# Patient Record
Sex: Male | Born: 1995 | Race: White | Hispanic: No | Marital: Single
Health system: Midwestern US, Community
[De-identification: ages and names within clinical notes are randomized; demographics above are authoritative.]

## PROBLEM LIST (undated history)

## (undated) HISTORY — PX: TYMPANOSTOMY TUBE PLACEMENT: SHX32

---

## 2015-05-18 ENCOUNTER — Emergency Department
Admission: EM | Admit: 2015-05-18 | Discharge: 2015-05-19 | Disposition: A | Discharge status: Home / Self Care | Attending: Emergency Medicine | Admitting: Emergency Medicine

## 2015-05-18 ENCOUNTER — Encounter: Payer: Self-pay | Admitting: *Deleted

## 2015-05-18 DIAGNOSIS — R197 Diarrhea, unspecified: Secondary | ICD-10-CM | POA: Diagnosis not present

## 2015-05-18 DIAGNOSIS — I951 Orthostatic hypotension: Secondary | ICD-10-CM

## 2015-05-18 DIAGNOSIS — R55 Syncope and collapse: Secondary | ICD-10-CM | POA: Diagnosis not present

## 2015-05-18 LAB — BASIC METABOLIC PANEL
Anion gap: 13 (ref 5–15)
BUN: 21 mg/dL — AB (ref 6–20)
CHLORIDE: 95 mmol/L — AB (ref 101–111)
CO2: 24 mmol/L (ref 22–32)
CREATININE: 1.4 mg/dL — AB (ref 0.61–1.24)
Calcium: 9.6 mg/dL (ref 8.9–10.3)
GFR calc non Af Amer: >60 mL/min (ref >60)
Glucose, Bld: 130 mg/dL — ABNORMAL HIGH (ref 65–99)
POTASSIUM: 3.8 mmol/L (ref 3.5–5.1)
Sodium: 132 mmol/L — ABNORMAL LOW (ref 135–145)

## 2015-05-18 LAB — CBC
HEMATOCRIT: 42.7 % (ref 40.0–52.0)
HEMOGLOBIN: 14.3 g/dL (ref 13.0–18.0)
MCH: 29.4 pg (ref 26.0–34.0)
MCHC: 33.5 g/dL (ref 32.0–36.0)
MCV: 87.5 fL (ref 80.0–100.0)
Platelets: 275 10*3/uL (ref 150–440)
RBC: 4.88 MIL/uL (ref 4.40–5.90)
RDW: 13.2 % (ref 11.5–14.5)
WBC: 17.8 10*3/uL — ABNORMAL HIGH (ref 3.8–10.6)

## 2015-05-18 LAB — HEPATIC FUNCTION PANEL
ALBUMIN: 4.4 g/dL (ref 3.5–5.0)
ALK PHOS: 61 U/L (ref 38–126)
ALT: 13 U/L — AB (ref 17–63)
AST: 20 U/L (ref 15–41)
Bilirubin, Direct: 0.2 mg/dL (ref 0.1–0.5)
Indirect Bilirubin: 1 mg/dL — ABNORMAL HIGH (ref 0.3–0.9)
Total Bilirubin: 1.2 mg/dL (ref 0.3–1.2)
Total Protein: 8.1 g/dL (ref 6.5–8.1)

## 2015-05-18 NOTE — ED Notes (Signed)
Pt to room 7 via wheelchair.  Pt had syncopal episode in triage.  Pt also has diarrhea x 2.  Pt pale.

## 2015-05-19 ENCOUNTER — Emergency Department

## 2015-05-19 LAB — TROPONIN I
Troponin I: <0.03 ng/mL (ref <0.031)
Troponin I: <0.03 ng/mL (ref <0.031)

## 2015-05-19 MED ORDER — ONDANSETRON 4 MG PO TBDP: 4.0000 mg | ORAL_TABLET | Freq: Three times a day (TID) | ORAL | Status: AC | PRN

## 2015-05-19 MED ORDER — SODIUM CHLORIDE 0.9 % IV BOLUS (SEPSIS)
1000.0000 mL | Freq: Once | INTRAVENOUS | Status: AC
  Administered 2015-05-19: 1000 mL via INTRAVENOUS

## 2015-05-19 NOTE — Discharge Instructions (Signed)
Orthostatic Hypotension Orthostatic hypotension is a sudden drop in blood pressure. It happens when you quickly stand up from a seated or lying position. You may feel dizzy or light-headed. This can last for just a few seconds or for up to a few minutes. It is usually not a serious problem. However, if this happens frequently or gets worse, it can be a sign of something more serious. CAUSES  Different things can cause orthostatic hypotension, including:   Loss of body fluids (dehydration).  Medicines that lower blood pressure.  Sudden changes in posture, such as standing up quickly after you have been sitting or lying down.  Taking too much of your medicine. SIGNS AND SYMPTOMS   Light-headedness or dizziness.   Fainting or near-fainting.   A fast heart rate.   Weakness.   Feeling tired (fatigue).  DIAGNOSIS  Your health care provider may do several things to help diagnose your condition and identify the cause. These may include:   Taking a medical history and doing a physical exam.  Checking your blood pressure. Your health care provider will check your blood pressure when you are:  Lying down.  Sitting.  Standing.  Using tilt table testing. In this test, you lie down on a table that moves from a lying position to a standing position. You will be strapped onto the table. This test monitors your blood pressure and heart rate when you are in different positions. TREATMENT  Treatment will vary depending on the cause. Possible treatments include:   Changing the dosage of your medicines.  Wearing compression stockings on your lower legs.  Standing up slowly after sitting or lying down.  Eating more salt.  Eating frequent, small meals.  In some cases, getting IV fluids.  Taking medicine to enhance fluid retention. HOME CARE INSTRUCTIONS  Only take over-the-counter or prescription medicines as directed by your health care provider.  Follow your health care  provider's instructions for changing the dosage of your current medicines.  Do not stop or adjust your medicine on your own.  Stand up slowly after sitting or lying down. This allows your body to adjust to the different position.  Wear compression stockings as directed.  Eat extra salt as directed.  Do not add extra salt to your diet unless directed to by your health care provider.  Eat frequent, small meals.  Avoid standing suddenly after eating.  Avoid hot showers or excessive heat as directed by your health care provider.  Keep all follow-up appointments. SEEK MEDICAL CARE IF:  You continue to feel dizzy or light-headed after standing.  You feel groggy or confused.  You feel cold, clammy, or sick to your stomach (nauseous).  You have blurred vision.  You feel short of breath. SEEK IMMEDIATE MEDICAL CARE IF:   You faint after standing.  You have chest pain.  You have difficulty breathing.   You lose feeling or movement in your arms or legs.   You have slurred speech or difficulty talking, or you are unable to talk.  MAKE SURE YOU:   Understand these instructions.  Will watch your condition.  Will get help right away if you are not doing well or get worse.   This information is not intended to replace advice given to you by your health care provider. Make sure you discuss any questions you have with your health care provider.   Document Released: 12/17/2001 Document Revised: 01/01/2013 Document Reviewed: 10/19/2012 Elsevier Interactive Patient Education 2016 Elsevier Inc.  Syncope Syncope  is a medical term for fainting or passing out. This means you lose consciousness and drop to the ground. People are generally unconscious for less than 5 minutes. You may have some muscle twitches for up to 15 seconds before waking up and returning to normal. Syncope occurs more often in older adults, but it can happen to anyone. While most causes of syncope are not  dangerous, syncope can be a sign of a serious medical problem. It is important to seek medical care.  CAUSES  Syncope is caused by a sudden drop in blood flow to the brain. The specific cause is often not determined. Factors that can bring on syncope include:  Taking medicines that lower blood pressure.  Sudden changes in posture, such as standing up quickly.  Taking more medicine than prescribed.  Standing in one place for too long.  Seizure disorders.  Dehydration and excessive exposure to heat.  Low blood sugar (hypoglycemia).  Straining to have a bowel movement.  Heart disease, irregular heartbeat, or other circulatory problems.  Fear, emotional distress, seeing blood, or severe pain. SYMPTOMS  Right before fainting, you may:  Feel dizzy or light-headed.  Feel nauseous.  See all white or all black in your field of vision.  Have cold, clammy skin. DIAGNOSIS  Your health care provider will ask about your symptoms, perform a physical exam, and perform an electrocardiogram (ECG) to record the electrical activity of your heart. Your health care provider may also perform other heart or blood tests to determine the cause of your syncope which may include:  Transthoracic echocardiogram (TTE). During echocardiography, sound waves are used to evaluate how blood flows through your heart.  Transesophageal echocardiogram (TEE).  Cardiac monitoring. This allows your health care provider to monitor your heart rate and rhythm in real time.  Holter monitor. This is a portable device that records your heartbeat and can help diagnose heart arrhythmias. It allows your health care provider to track your heart activity for several days, if needed.  Stress tests by exercise or by giving medicine that makes the heart beat faster. TREATMENT  In most cases, no treatment is needed. Depending on the cause of your syncope, your health care provider may recommend changing or stopping some of your  medicines. HOME CARE INSTRUCTIONS  Have someone stay with you until you feel stable.  Do not drive, use machinery, or play sports until your health care provider says it is okay.  Keep all follow-up appointments as directed by your health care provider.  Lie down right away if you start feeling like you might faint. Breathe deeply and steadily. Wait until all the symptoms have passed.  Drink enough fluids to keep your urine clear or pale yellow.  If you are taking blood pressure or heart medicine, get up slowly and take several minutes to sit and then stand. This can reduce dizziness. SEEK IMMEDIATE MEDICAL CARE IF:   You have a severe headache.  You have unusual pain in the chest, abdomen, or back.  You are bleeding from your mouth or rectum, or you have black or tarry stool.  You have an irregular or very fast heartbeat.  You have pain with breathing.  You have repeated fainting or seizure-like jerking during an episode.  You faint when sitting or lying down.  You have confusion.  You have trouble walking.  You have severe weakness.  You have vision problems. If you fainted, call your local emergency services (911 in [place] Do not drive yourself  to the hospital.    This information is not intended to replace advice given to you by your health care provider. Make sure you discuss any questions you have with your health care provider.   Document Released: 12/27/2004 Document Revised: 05/13/2014 Document Reviewed: 02/25/2011 Elsevier Interactive Patient Education 2016 Elsevier Inc.  Diarrhea Diarrhea is frequent loose and watery bowel movements. It can cause you to feel weak and dehydrated. Dehydration can cause you to become tired and thirsty, have a dry mouth, and have decreased urination that often is dark yellow. Diarrhea is a sign of another problem, most often an infection that will not last long. In most cases, diarrhea typically lasts 2-3 days. However, it can  last longer if it is a sign of something more serious. It is important to treat your diarrhea as directed by your caregiver to lessen or prevent future episodes of diarrhea. CAUSES  Some common causes include:  Gastrointestinal infections caused by viruses, bacteria, or parasites.  Food poisoning or food allergies.  Certain medicines, such as antibiotics, chemotherapy, and laxatives.  Artificial sweeteners and fructose.  Digestive disorders. HOME CARE INSTRUCTIONS  Ensure adequate fluid intake (hydration): Have 1 cup (8 oz) of fluid for each diarrhea episode. Avoid fluids that contain simple sugars or sports drinks, fruit juices, whole milk products, and sodas. Your urine should be clear or pale yellow if you are drinking enough fluids. Hydrate with an oral rehydration solution that you can purchase at pharmacies, retail stores, and online. You can prepare an oral rehydration solution at home by mixing the following ingredients together:   - tsp table salt.   tsp baking soda.   tsp salt substitute containing potassium chloride.  1  tablespoons sugar.  1 L (34 oz) of water.  Certain foods and beverages may increase the speed at which food moves through the gastrointestinal (GI) tract. These foods and beverages should be avoided and include:  Caffeinated and alcoholic beverages.  High-fiber foods, such as raw fruits and vegetables, nuts, seeds, and whole grain breads and cereals.  Foods and beverages sweetened with sugar alcohols, such as xylitol, sorbitol, and mannitol.  Some foods may be well tolerated and may help thicken stool including:  Starchy foods, such as rice, toast, pasta, low-sugar cereal, oatmeal, grits, baked potatoes, crackers, and bagels.  Bananas.  Applesauce.  Add probiotic-rich foods to help increase healthy bacteria in the GI tract, such as yogurt and fermented milk products.  Wash your hands well after each diarrhea episode.  Only take  over-the-counter or prescription medicines as directed by your caregiver.  Take a warm bath to relieve any burning or pain from frequent diarrhea episodes. SEEK IMMEDIATE MEDICAL CARE IF:   You are unable to keep fluids down.  You have persistent vomiting.  You have blood in your stool, or your stools are black and tarry.  You do not urinate in 6-8 hours, or there is only a small amount of very dark urine.  You have abdominal pain that increases or localizes.  You have weakness, dizziness, confusion, or light-headedness.  You have a severe headache.  Your diarrhea gets worse or does not get better.  You have a fever or persistent symptoms for more than 2-3 days.  You have a fever and your symptoms suddenly get worse. MAKE SURE YOU:   Understand these instructions.  Will watch your condition.  Will get help right away if you are not doing well or get worse.   This information is not  intended to replace advice given to you by your health care provider. Make sure you discuss any questions you have with your health care provider.   Document Released: 12/17/2001 Document Revised: 01/17/2014 Document Reviewed: 09/04/2011 Elsevier Interactive Patient Education 2016 [employer].  Food Choices to Help Relieve Diarrhea, Adult When you have diarrhea, the foods you eat and your eating habits are very important. Choosing the right foods and drinks can help relieve diarrhea. Also, because diarrhea can last up to 7 days, you need to replace lost fluids and electrolytes (such as sodium, potassium, and chloride) in order to help prevent dehydration.  WHAT GENERAL GUIDELINES DO I NEED TO FOLLOW?  Slowly drink 1 cup (8 oz) of fluid for each episode of diarrhea. If you are getting enough fluid, your urine will be clear or pale yellow.  Eat starchy foods. Some good choices include white rice, white toast, pasta, low-fiber cereal, baked potatoes (without the skin), saltine crackers, and  bagels.  Avoid large servings of any cooked vegetables.  Limit fruit to two servings per day. A serving is  cup or 1 small piece.  Choose foods with less than 2 g of fiber per serving.  Limit fats to less than 8 tsp (38 g) per day.  Avoid fried foods.  Eat foods that have probiotics in them. Probiotics can be found in certain dairy products.  Avoid foods and beverages that may increase the speed at which food moves through the stomach and intestines (gastrointestinal tract). Things to avoid include:  High-fiber foods, such as dried fruit, raw fruits and vegetables, nuts, seeds, and whole grain foods.  Spicy foods and high-fat foods.  Foods and beverages sweetened with high-fructose corn syrup, honey, or sugar alcohols such as xylitol, sorbitol, and mannitol. WHAT FOODS ARE RECOMMENDED? Grains White rice. White, [place], or pita breads (fresh or toasted), including plain rolls, buns, or bagels. White pasta. Saltine, soda, or graham crackers. Pretzels. Low-fiber cereal. Cooked cereals made with water (such as cornmeal, farina, or cream cereals). Plain muffins. Matzo. Melba toast. Zwieback.  Vegetables Potatoes (without the skin). Strained tomato and vegetable juices. Most well-cooked and canned vegetables without seeds. Tender lettuce. Fruits Cooked or canned applesauce, apricots, cherries, fruit cocktail, grapefruit, peaches, pears, or plums. Fresh bananas, apples without skin, cherries, grapes, cantaloupe, grapefruit, peaches, oranges, or plums.  Meat and Other Protein Products Baked or boiled chicken. Eggs. Tofu. Fish. Seafood. Smooth peanut butter. Ground or well-cooked tender beef, ham, veal, lamb, pork, or poultry.  Dairy Plain yogurt, kefir, and unsweetened liquid yogurt. Lactose-free milk, buttermilk, or soy milk. Plain hard cheese. Beverages Sport drinks. Clear broths. Diluted fruit juices (except prune). Regular, caffeine-free sodas such as ginger ale. Water. Decaffeinated  teas. Oral rehydration solutions. Sugar-free beverages not sweetened with sugar alcohols. Other Bouillon, broth, or soups made from recommended foods.  The items listed above may not be a complete list of recommended foods or beverages. Contact your dietitian for more options. WHAT FOODS ARE NOT RECOMMENDED? Grains Whole grain, whole wheat, bran, or rye breads, rolls, pastas, crackers, and cereals. Wild or brown rice. Cereals that contain more than 2 g of fiber per serving. Corn tortillas or taco shells. Cooked or dry oatmeal. Granola. Popcorn. Vegetables Raw vegetables. Cabbage, broccoli, Brussels sprouts, artichokes, baked beans, beet greens, corn, kale, legumes, peas, sweet potatoes, and yams. Potato skins. Cooked spinach and cabbage. Fruits Dried fruit, including raisins and dates. Raw fruits. Stewed or dried prunes. Fresh apples with skin, apricots, mangoes, pears, raspberries, and  strawberries.  Meat and Other Protein Products Chunky peanut butter. Nuts and seeds. Beans and lentils. Tomasa BlaseBacon.  Dairy High-fat cheeses. Milk, chocolate milk, and beverages made with milk, such as milk shakes. Cream. Ice cream. Sweets and Desserts Sweet rolls, doughnuts, and sweet breads. Pancakes and waffles. Fats and Oils Butter. Cream sauces. Margarine. Salad oils. Plain salad dressings. Olives. Avocados.  Beverages Caffeinated beverages (such as coffee, tea, soda, or energy drinks). Alcoholic beverages. Fruit juices with pulp. Prune juice. Soft drinks sweetened with high-fructose corn syrup or sugar alcohols. Other Coconut. Hot sauce. Chili powder. Mayonnaise. Gravy. Cream-based or milk-based soups.  The items listed above may not be a complete list of foods and beverages to avoid. Contact your dietitian for more information. WHAT SHOULD I DO IF I BECOME DEHYDRATED? Diarrhea can sometimes lead to dehydration. Signs of dehydration include dark urine and dry mouth and skin. If you think you are dehydrated,  you should rehydrate with an oral rehydration solution. These solutions can be purchased at pharmacies, retail stores, or online.  Drink -1 cup (120-240 mL) of oral rehydration solution each time you have an episode of diarrhea. If drinking this amount makes your diarrhea worse, try drinking smaller amounts more often. For example, drink 1-3 tsp (5-15 mL) every 5-10 minutes.  A general rule for staying hydrated is to drink 1-2 L of fluid per day. Talk to your health care provider about the specific amount you should be drinking each day. Drink enough fluids to keep your urine clear or pale yellow.   This information is not intended to replace advice given to you by your health care provider. Make sure you discuss any questions you have with your health care provider.   Document Released: 03/19/2003 Document Revised: 01/17/2014 Document Reviewed: 11/19/2012 Elsevier Interactive Patient Education [employer]2016 Elsevier Inc.

## 2015-05-19 NOTE — ED Provider Notes (Signed)
[facility] Emergency Department Provider Note   ____________________________________________  Time seen: Approximately 2357 AM  I have reviewed the triage vital signs and the nursing notes.   HISTORY  Chief Complaint Loss of Consciousness and Diarrhea    HPI Jonathon Franco is a 20 y.o. male comes into the hospital today with a syncopal event. He reports that he was feeling ill since Saturday. He had his first episode of diarrhea today and reports that when he was walking back to his room he lost vision and blacked out. He reports that when he arrived to the hospital he had another syncopal event. He also had some diarrhea incontinence. The patient reports that he was found by his roommate on the floor and he is unsure how long he was out. The patient also reports he hit his head on the door frame when he passed out he has a small headache. He denies any abdominal pain. He reports the diarrhea was watery brown and mucousy. He had some mild shortness of breath before he passed out but nothing else. The patient reports he feels dizzy and lightheaded when he stands or sits up. This is never occurred before. The patient's friend who is with him had similar symptoms of diarrhea and syncope recently and also had to be in the hospital for severe dehydration.   History reviewed. No pertinent past medical history.  There are no active problems to display for this patient.   History reviewed. No pertinent past surgical history.  Current Outpatient Rx  Name  Route  Sig  Dispense  Refill  . amphetamine-dextroamphetamine (ADDERALL XR) 20 MG 24 hr capsule   Oral   Take 40 mg by mouth daily.      0   . ondansetron (ZOFRAN ODT) 4 MG disintegrating tablet   Oral   Take 1 tablet (4 mg total) by mouth every 8 (eight) hours as needed for nausea or vomiting.   20 tablet   0     Allergies Biaxin; Cobalt; Erythromycin; Morphine and related; Nickel; Penicillins; and  Sulfa antibiotics  History reviewed. No pertinent family history.  Social History Social History  Substance Use Topics  . Smoking status: Never Smoker   . Smokeless tobacco: None  . Alcohol Use: No    Review of Systems Constitutional: No fever/chills Eyes: No visual changes. ENT: No sore throat. Cardiovascular: Denies chest pain. Respiratory: Mild shortness of breath. Gastrointestinal:Diarrhea with no nausea or vomiting Genitourinary: Negative for dysuria. Musculoskeletal: Negative for back pain. Skin: Negative for rash. Neurological: Syncope  10-point ROS otherwise negative.  ____________________________________________   PHYSICAL EXAM:  VITAL SIGNS: ED Triage Vitals  Enc Vitals Group     BP 05/18/15 2257 111/74 mmHg     Pulse Rate 05/18/15 2257 99     Resp 05/18/15 2257 20     Temp 05/18/15 2257 97.4 F (36.3 C)     Temp Source 05/18/15 2257 Oral     SpO2 05/18/15 2257 99 %     Weight 05/18/15 2257 180 lb (81.647 kg)     Height 05/18/15 2257 6\' 6"  (1.981 m)     Head Cir --      Peak Flow --      Pain Score 05/18/15 2259 3     Pain Loc --      Pain Edu? --      Excl. in GC? --     Constitutional: Alert and oriented. Well appearing and inMild distress. Eyes:  Conjunctivae are normal. PERRL. EOMI. Head: Atraumatic. Nose: No congestion/rhinnorhea. Mouth/Throat: Mucous membranes are moist.  Oropharynx non-erythematous. Cardiovascular: Tachycardia regular rhythm. Grossly normal heart sounds.  Good peripheral circulation. Respiratory: Normal respiratory effort.  No retractions. Lungs CTAB. Gastrointestinal: Soft and nontender. No distention. Positive bowel sounds  Musculoskeletal: No lower extremity tenderness nor edema.   Neurologic:  Normal speech and language. Cranial nerves II through XII are grossly intact with no focal motor or neuro deficits. Skin:  Skin is warm, dry and intact.  Psychiatric: Mood and affect are normal.    ____________________________________________   LABS (all labs ordered are listed, but only abnormal results are displayed)  Labs Reviewed  CBC - Abnormal; Notable for the following:    WBC 17.8 (*)    All other components within normal limits  BASIC METABOLIC PANEL - Abnormal; Notable for the following:    Sodium 132 (*)    Chloride 95 (*)    Glucose, Bld 130 (*)    BUN 21 (*)    Creatinine, Ser 1.40 (*)    All other components within normal limits  HEPATIC FUNCTION PANEL - Abnormal; Notable for the following:    ALT 13 (*)    Indirect Bilirubin 1.0 (*)    All other components within normal limits  TROPONIN I  TROPONIN I   ____________________________________________  EKG  ED ECG REPORT I, Rebecka ApleyWebster,  Allison P, the attending physician, personally viewed and interpreted this ECG.   Date: 05/19/2015  EKG Time: 2257  Rate: 100  Rhythm: normal sinus rhythm  Axis: Normal  Intervals:none  ST&T Change: Flipped T waves in leads V2  ____________________________________________  RADIOLOGY  CT head: Normal brain, severe paranasal disease, particularly on the right, chronicity indeterminate ____________________________________________   PROCEDURES  Procedure(s) performed: None  Critical Care performed: No  ____________________________________________   INITIAL IMPRESSION / ASSESSMENT AND PLAN / ED COURSE  Pertinent labs & imaging results that were available during my care of the patient were reviewed by me and considered in my medical decision making (see chart for details).  This is a 20 year old male who comes in for hospital today with some diarrhea and a syncopal event. The patient appears to be dehydrated as he has some mild elevation of his creatinine as well as a white blood cell count of 17. The patient is not having any pain at this time. He received a liter of normal saline but still remains orthostatic with a heart rate that goes from the 90s to 115  laying to standing. I will give the patient a second liter of normal saline and sent him for a CT scan of his head. I will also in short to check the patient's troponin. He'll be reassessed once he received his second liter.  The patient has been sitting in his room he is not having any feelings of nausea and he has no longer having feelings of dizziness. His heart rate is improved and he reports he is ready to go. I did repeat the troponin and it was also negative. The patient will be discharged to home to follow-up with acute care clinic or with student health. The patient has no further complaints or concerns and he understands the plans as stated. ____________________________________________   FINAL CLINICAL IMPRESSION(S) / ED DIAGNOSES  Final diagnoses:  Syncope, unspecified syncope type  Orthostatic hypotension  Diarrhea of presumed infectious origin      NEW MEDICATIONS STARTED DURING THIS VISIT:  New Prescriptions   ONDANSETRON (ZOFRAN ODT)  4 MG DISINTEGRATING TABLET    Take 1 tablet (4 mg total) by mouth every 8 (eight) hours as needed for nausea or vomiting.     Note:  This document was prepared using Dragon voice recognition software and may include unintentional dictation errors.    Rebecka Apley, MD 05/19/15 [phone]

## 2016-12-26 ENCOUNTER — Ambulatory Visit
Admit: 2016-12-26 | Discharge: 2016-12-26 | Payer: PRIVATE HEALTH INSURANCE | Attending: Surgery | Primary: Family Medicine

## 2016-12-26 DIAGNOSIS — L723 Sebaceous cyst: Secondary | ICD-10-CM

## 2016-12-26 NOTE — Progress Notes (Signed)
[facility] History and Physical    History of Present Illness:      Samuel Carrillo is a 21 y.o. male who has been having an infection under the left axilla.  He has had one or two recent infections of a possible cyst of the L axilla. He has been on abx recently and the infection has cleared. He does not have any pain or redness of the area currently.  He also has a cyst of the upper back.  This one has never been infected and does not cause any pain or issues.      PMH - none    Past Surgical History:   Procedure Laterality Date   ??? HX HEENT     ??? HX TYMPANOSTOMY     ??? HX WISDOM TEETH EXTRACTION           Current Outpatient Medications:   ???  ADDERALL XR 20 mg XR capsule, , Disp: , Rfl:     Allergies   Allergen Reactions   ??? Erythromycin Rash   ??? Morphine Rash   ??? Nickel Rash   ??? Pcn [Penicillins] Rash   ??? Sulfa (Sulfonamide Antibiotics) Rash       Social History     Socioeconomic History   ??? Marital status: SINGLE     Spouse name: Not on file   ??? Number of children: Not on file   ??? Years of education: Not on file   ??? Highest education level: Not on file   Social Needs   ??? Financial resource strain: Not on file   ??? Food insecurity - worry: Not on file   ??? Food insecurity - inability: Not on file   ??? Transportation needs - medical: Not on file   ??? Transportation needs - non-medical: Not on file   Occupational History   ??? Not on file   Tobacco Use   ??? Smoking status: Never Smoker   ??? Smokeless tobacco: Never Used   Substance and Sexual Activity   ??? Alcohol use: Yes     Alcohol/week: 2.4 - 3.0 oz     Types: 4 - 5 Cans of beer per week     Frequency: Never   ??? Drug use: No   ??? Sexual activity: No   Other Topics Concern   ??? Not on file   Social History Narrative   ??? Not on file       Family History   Problem Relation Age of Onset   ??? Cancer Mother    ??? Cancer Maternal Grandmother    ??? Diabetes Maternal Grandmother    ??? Heart Disease Paternal Grandfather    ??? Cancer Paternal Grandfather        ROS    Constitutional: negative  Ears, Nose, Mouth, Throat, and Face: negative  Respiratory: negative  Cardiovascular: negative  Gastrointestinal: negative  Genitourinary:negative  Integument/Breast: negative  Hematologic/Lymphatic: negative  Behavioral/Psychiatric: negative  Allergic/Immunologic: negative      Physical Exam:     Visit Vitals  BP 111/73 (BP 1 Location: Left arm, BP Patient Position: Sitting)   Pulse (!) 131   Temp 98 ??F (36.7 ??C) (Oral)   Resp 18   Ht 6\' 6"  (1.981 m)   Wt 166 lb 12.8 oz (75.7 kg)   SpO2 97%   BMI 19.28 kg/m??       General - alert and oriented, no apparent distress  HEENT - no jaundice, no hearing imparement  Pulm - CTAB, no C/W/R  CV - RRR, no M/R/G  Abd - soft, ND  Ext - pulses intact in UE and LE bilaterally, no edema  Skin - supple, no rashes, L axilla skin normal no cyst or infection present, upper back with small 3mm sebaceous cyst, no erythema or drainage  Psychiatric - normal affect, good mood    Labs  none    Imaging  none  I have reviewed and agree with all of the pertinent images    Assessment:     Samuel Carrillo is a [age] y.oAlda Carrillo. male with sebaceous cyst of the back    Recommendations:     1.   He does not appear to have any cyst in the left axilla that I can see or feel on exam.  No surgery is needed for this.  He does have a small sebaceous cyst of the upper back and has not had any issues with this.  He would like to avoid surgery at this time which I think is reasonable.  He will follow up with me PRN.    Marton RedwoodNathan Zorion Nims, MD    Mr. Doristine CounterBurnett has a reminder for a "due or due soon" health maintenance. I have asked that he contact his primary care provider for follow-up on this health maintenance.

## 2016-12-26 NOTE — Progress Notes (Signed)
1. Have you been to the ER, urgent care clinic since your last visit?  Hospitalized since your last visit?No    2. Have you seen or consulted any other health care providers outside of the [facility] since your last visit?  Include any pap smears or colon screening. No

## 2016-12-26 NOTE — Patient Instructions (Signed)
Epidermoid Cyst: Care Instructions  Your Care Instructions  An epidermoid (say "eh-pih-DER-moyd") cyst is a lump just under the skin. These cysts can form when a hair follicle becomes blocked. They are common in acne and may occur on the face, neck, back, and genitals. However, they can form anywhere on the body. These cysts are not cancer and do not lead to cancer. They tend not to hurt, but they can sometimes become swollen and painful. They also may break open (rupture) and cause scarring.  These cysts sometimes do not cause problems and may not need treatment. If you have a cyst that is swollen and hurts, your doctor may inject it with a medicine to help it heal. But it is more likely that a painful cyst will need to be removed. Your doctor will give you a shot of numbing medicine and cut into the cyst to drain it or remove it. This makes the symptoms go away.  Follow-up care is a key part of your treatment and safety. Be sure to make and go to all appointments, and call your doctor if you are having problems. It's also a good idea to know your test results and keep a list of the medicines you take.  How can you care for yourself at home?  ?? Do not squeeze the cyst or poke it with a needle to open it. This can cause swelling, redness, and infection.  ?? Always have a doctor look at any new lumps you get to make sure that they are not serious.  When should you call for help?  Watch closely for changes in your health, and be sure to contact your doctor if:  ?? ?? You have a fever, redness, or swelling after you get a shot of medicine in the cyst.   ?? ?? You see or feel a new lump on your skin.   Where can you learn more?  Go to http://www.healthwise.net/[employer].  Enter S615 in the search box to learn more about "Epidermoid Cyst: Care Instructions."  Current as of: April 27, 2016  Content Version: 11.8  ?? 2006-2018 [employer]. Care instructions adapted under  license by [employer] (which disclaims liability or warranty for this information). If you have questions about a medical condition or this instruction, always ask your healthcare professional. Healthwise, Incorporated disclaims any warranty or liability for your use of this information.

## 2017-01-28 ENCOUNTER — Emergency Department
Admission: EM | Admit: 2017-01-28 | Discharge: 2017-01-28 | Disposition: A | Discharge status: Home / Self Care | Attending: Emergency Medicine | Admitting: Emergency Medicine

## 2017-01-28 ENCOUNTER — Emergency Department

## 2017-01-28 DIAGNOSIS — Y929 Unspecified place or not applicable: Secondary | ICD-10-CM | POA: Insufficient documentation

## 2017-01-28 DIAGNOSIS — Y999 Unspecified external cause status: Secondary | ICD-10-CM | POA: Diagnosis not present

## 2017-01-28 DIAGNOSIS — S62326A Displaced fracture of shaft of fifth metacarpal bone, right hand, initial encounter for closed fracture: Secondary | ICD-10-CM | POA: Diagnosis not present

## 2017-01-28 DIAGNOSIS — S6991XA Unspecified injury of right wrist, hand and finger(s), initial encounter: Secondary | ICD-10-CM | POA: Diagnosis present

## 2017-01-28 DIAGNOSIS — Z79899 Other long term (current) drug therapy: Secondary | ICD-10-CM | POA: Diagnosis not present

## 2017-01-28 DIAGNOSIS — S62324A Displaced fracture of shaft of fourth metacarpal bone, right hand, initial encounter for closed fracture: Secondary | ICD-10-CM | POA: Diagnosis not present

## 2017-01-28 DIAGNOSIS — Y939 Activity, unspecified: Secondary | ICD-10-CM | POA: Insufficient documentation

## 2017-01-28 DIAGNOSIS — W2209XA Striking against other stationary object, initial encounter: Secondary | ICD-10-CM | POA: Diagnosis not present

## 2017-01-28 DIAGNOSIS — F17210 Nicotine dependence, cigarettes, uncomplicated: Secondary | ICD-10-CM | POA: Diagnosis not present

## 2017-01-28 DIAGNOSIS — T1490XA Injury, unspecified, initial encounter: Secondary | ICD-10-CM

## 2017-01-28 MED ORDER — OXYCODONE-ACETAMINOPHEN 5-325 MG PO TABS: 1.0000 | ORAL_TABLET | Freq: Four times a day (QID) | ORAL | 0 refills | Status: AC | PRN

## 2017-01-28 MED ORDER — DOCUSATE SODIUM 100 MG PO CAPS: ORAL_CAPSULE | ORAL | 0 refills | Status: AC

## 2017-01-28 NOTE — ED Notes (Signed)
Pt friend to desk again "can we just get someone in here to fix his hand, he's getting anxious". Delay again explained and this RN apologized to pt and friends for the wait

## 2017-01-28 NOTE — ED Triage Notes (Signed)
Patient reports etoh on board.
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
## 2017-01-28 NOTE — ED Triage Notes (Signed)
Patient c/o reports fall approx 1 hour ago. Patient has hand swelling to wrist and hand.
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
## 2017-01-28 NOTE — ED Notes (Addendum)
ED Provider at bedside. Update from ortho doc and plan of care

## 2017-01-28 NOTE — ED Notes (Signed)

## 2017-01-28 NOTE — Discharge Instructions (Signed)
As we discussed, you have displaced fractures in the fourth and fifth bones in your right hand (metacarpals).  We splinted you temporarily to keep them immobilized, but it is very important that you follow up next week with the orthopedic specialist.  We provided the name and phone number for Dr. Hyacinth MeekerMiller, but any of his colleagues will be able to help you as well.  You may require surgery (pinning of the bones) to provide a long-term fix.  We recommend you call the office on Monday morning and tell them you were seen in the emergency department overnight on Friday, that you have a broken hand, and that Dr. Hyacinth MeekerMiller said you needed a follow-up appointment.  They will be able to schedule you for an appropriate date and time.  Please read through the included information.  Keep your splint clean and dry.  If your pain worsens significantly or if you lose sensation in your fingers, please return immediately to the emergency department.  Use over-the-counter ibuprofen and/or Tylenol as needed according to label instructions for pain.  Take Percocet as prescribed for severe pain. Do not drink alcohol, drive or participate in any other potentially dangerous activities while taking this medication as it may make you sleepy. Do not take this medication with any other sedating medications, either prescription or over-the-counter. If you were prescribed Percocet or Vicodin, do not take these with acetaminophen (Tylenol) as it is already contained within these medications.   This medication is an opiate (or narcotic) pain medication and can be habit forming.  Use it as little as possible to achieve adequate pain control.  Do not use or use it with extreme caution if you have a history of opiate abuse or dependence.  If you are on a pain contract with your primary care doctor or a pain specialist, be sure to let them know you were prescribed this medication today from the [facility].  This  medication is intended for your use only - do not give any to anyone else and keep it in a secure place where nobody else, especially children, have access to it.  It will also cause or worsen constipation, so you may want to consider taking an over-the-counter stool softener while you are taking this medication.

## 2017-01-28 NOTE — ED Notes (Signed)
esignature did not transfer to screen

## 2017-01-28 NOTE — ED Notes (Signed)
Friend to nurses desk requesting to please get a splint and leave, "we're all just really tired." pt friend was reassured EDP will be with pt as soon as possible

## 2017-01-28 NOTE — ED Notes (Addendum)
ED Provider at bedside. Discussion of splint
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
## 2017-01-30 ENCOUNTER — Inpatient Hospital Stay
Admit: 2017-01-30 | Discharge: 2017-01-30 | Disposition: A | Discharge status: Home / Self Care | Payer: TRICARE (CHAMPUS) | Attending: Emergency Medicine

## 2017-01-30 ENCOUNTER — Emergency Department: Admit: 2017-01-30 | Payer: TRICARE (CHAMPUS) | Primary: Family Medicine

## 2017-01-30 DIAGNOSIS — S62304A Unspecified fracture of fourth metacarpal bone, right hand, initial encounter for closed fracture: Secondary | ICD-10-CM

## 2017-01-30 MED ORDER — HYDROCODONE-ACETAMINOPHEN 7.5 MG-325 MG TAB
ORAL_TABLET | Freq: Three times a day (TID) | ORAL | 0 refills | Status: AC | PRN
Start: 2017-01-30 — End: ?

## 2017-01-30 NOTE — ED Triage Notes (Signed)
Patient states he broke his right 4th and 5th fingers. Patient was told he needed to have surgery but needs a referral to orthopedics.

## 2017-01-30 NOTE — ED Notes (Signed)
NP reviewed discharge instructions with patient. Patient verbalized understanding. Time allotted for questions. A&O at time of discharge. VSS. Patient ambulatory off unit.
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
## 2017-06-04 IMAGING — CT CT HEAD W/O CM
1 series · 16 of 30 positions shown, 20 images · non-contrast
Comparison: None.

CLINICAL DATA: Syncope, striking the top right side of his head.

EXAM:
CT HEAD WITHOUT CONTRAST
TECHNIQUE: Contiguous axial images were obtained from the base of the skull
through the vertex without intravenous contrast.

[Series 2: head wo · axial · 0.47mm/px · z∈[-149,+9]mm · 16 of 36 slices shown, 20 images]
[im 2/36  brain]
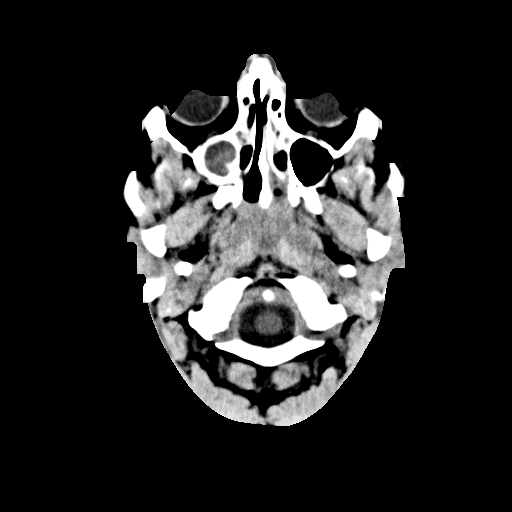
[im 2/36  bone]
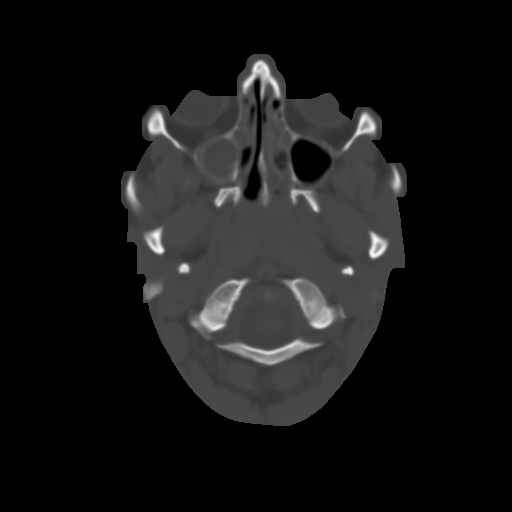
[im 4/36  brain]
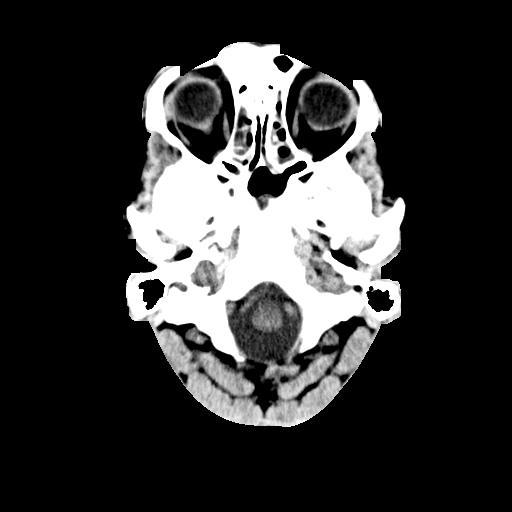
[im 7/36  brain]
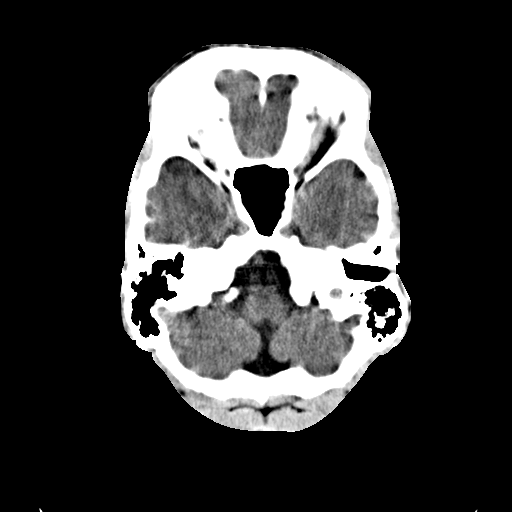
[im 9/36  brain]
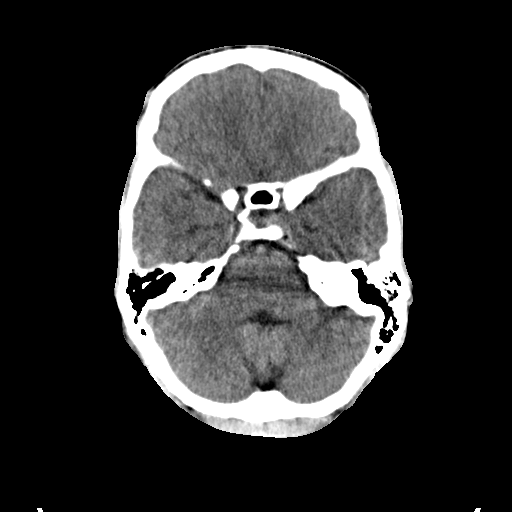
[im 10/36  brain]
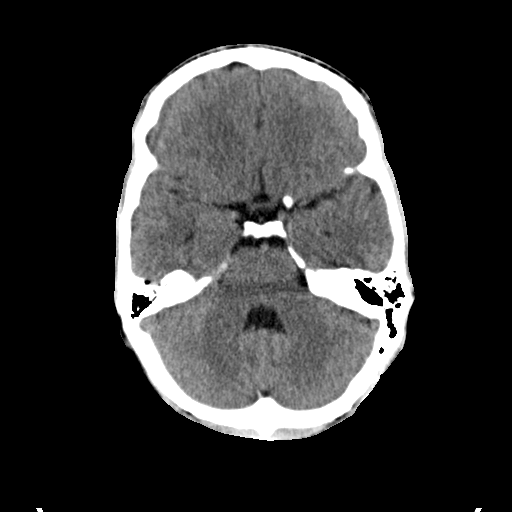
[im 10/36  bone]
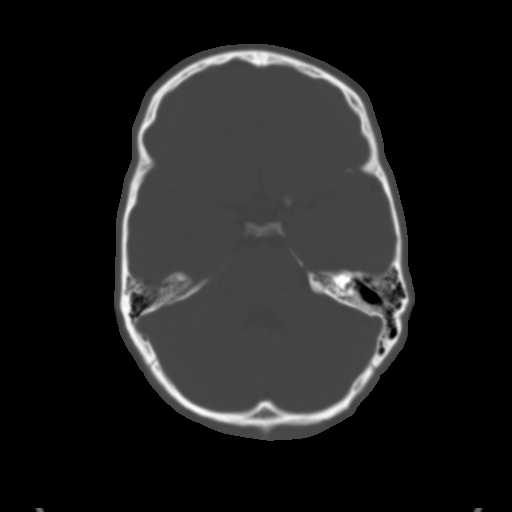
[im 13/36  brain]
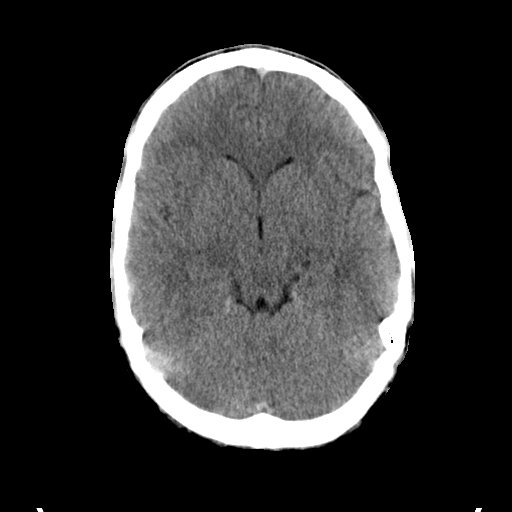
[im 15/36  brain]
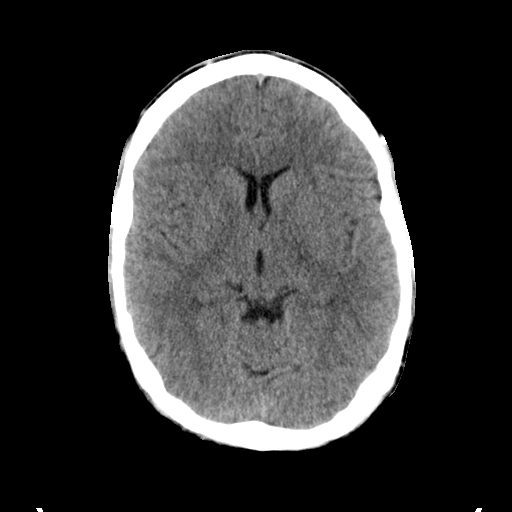
[im 17/36  brain]
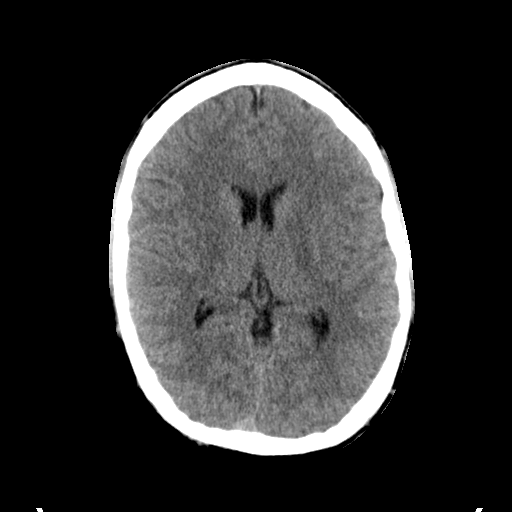
[im 19/36  brain]
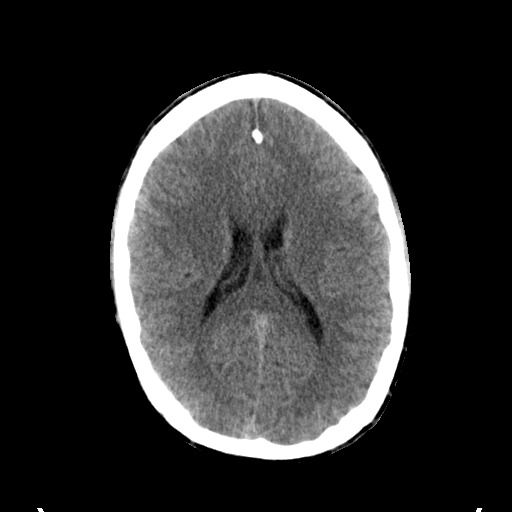
[im 19/36  bone]
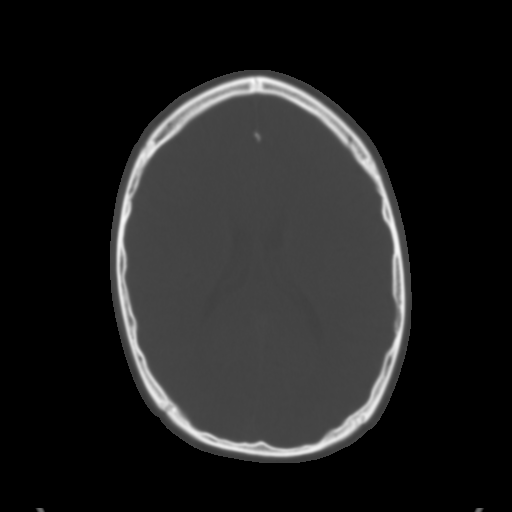
[im 21/36  brain]
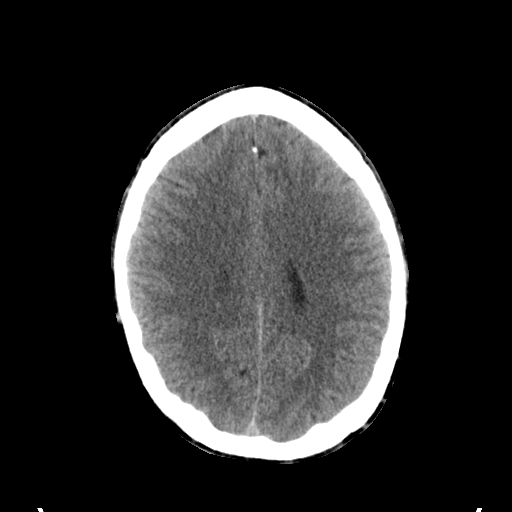
[im 23/36  brain]
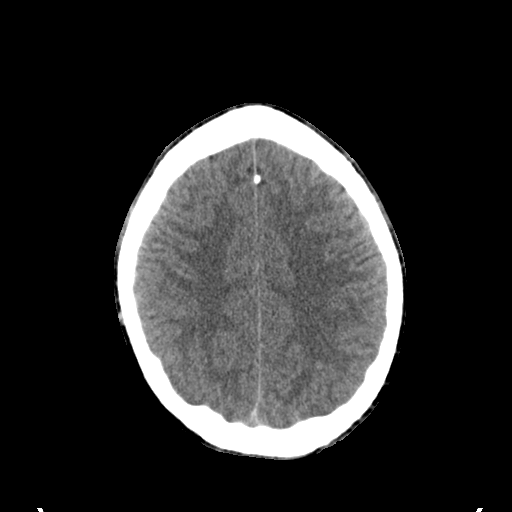
[im 26/36  brain]
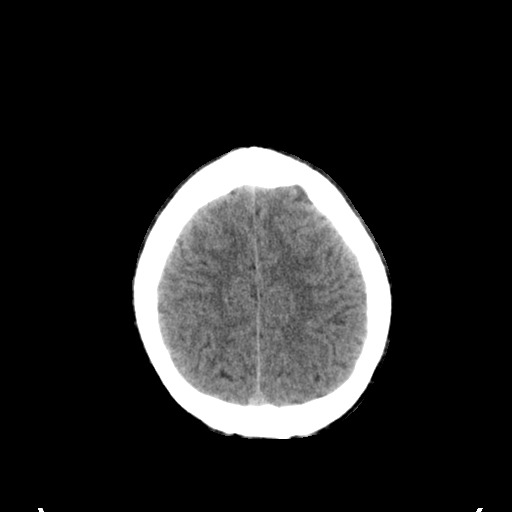
[im 27/36  brain]
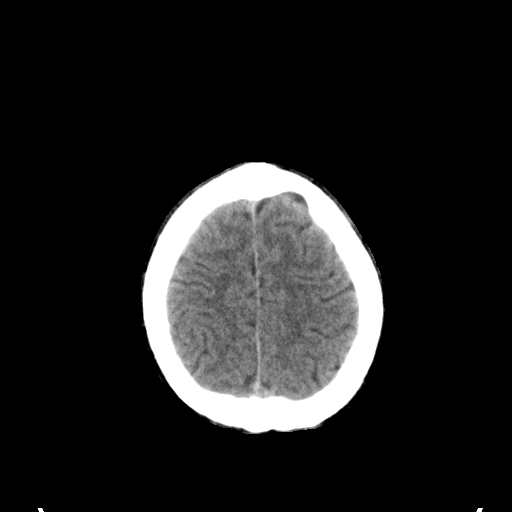
[im 27/36  bone]
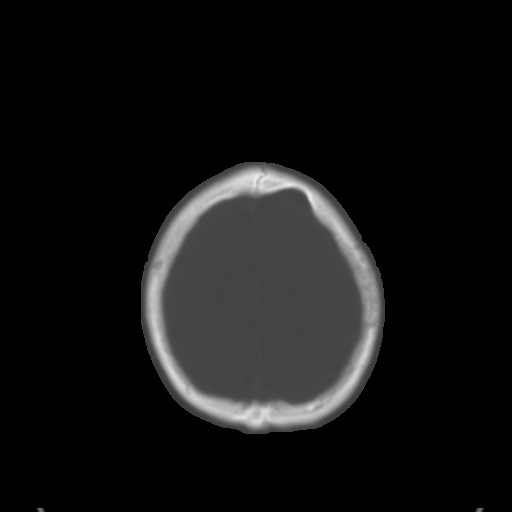
[im 29/36  brain]
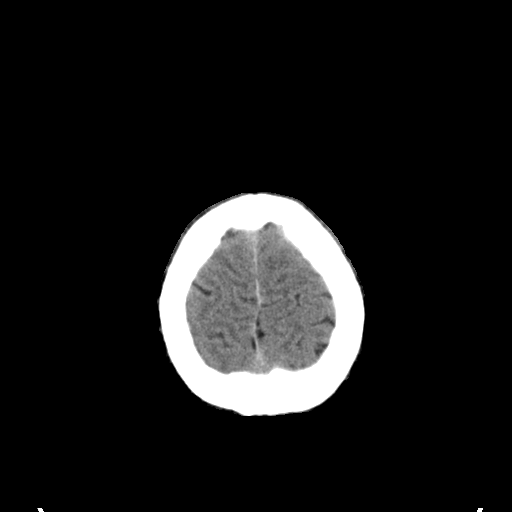
[im 32/36  brain]
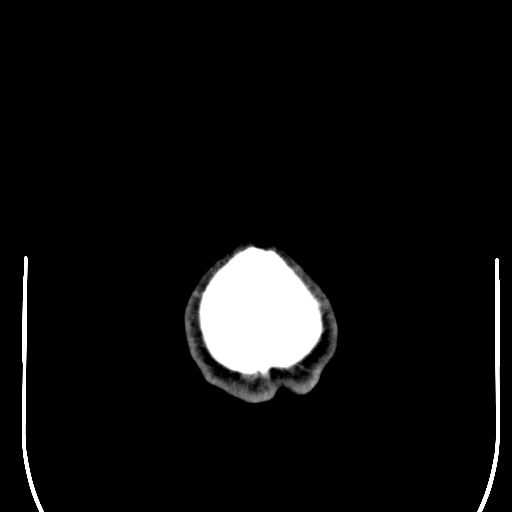
[im 34/36  brain]
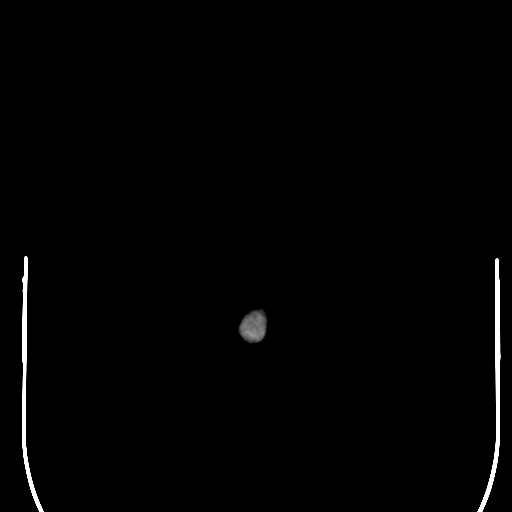

[16 of 30 positions shown; findings below may reference images not displayed]

FINDINGS: There is no intracranial hemorrhage, mass or evidence of acute
infarction. There is no extra-axial fluid collection. Gray matter
and white matter appear normal. Cerebral volume is normal for age.
Brainstem and posterior fossa are unremarkable. The CSF spaces
appear normal.

The bony structures are intact. There is near complete opacification
of the ethmoid air cells. Right frontal sinus is completely
opacified. Upper portion of the right maxillary sinus is completely
opacified. No frank bony destruction. The orbits are unremarkable.
IMPRESSION: 1. Normal brain
2. Severe paranasal sinus disease, particularly on the right.
Chronicity indeterminate.

## 2019-02-14 IMAGING — CR DG HAND COMPLETE 3+V*R*
3 series · 3 of 3 positions shown · non-contrast
Comparison: None.

CLINICAL DATA: 21-year-old male with fall and right wrist pain.

EXAM:
RIGHT HAND - COMPLETE 3+ VIEW; RIGHT WRIST - COMPLETE 3+ VIEW

[hand ap]
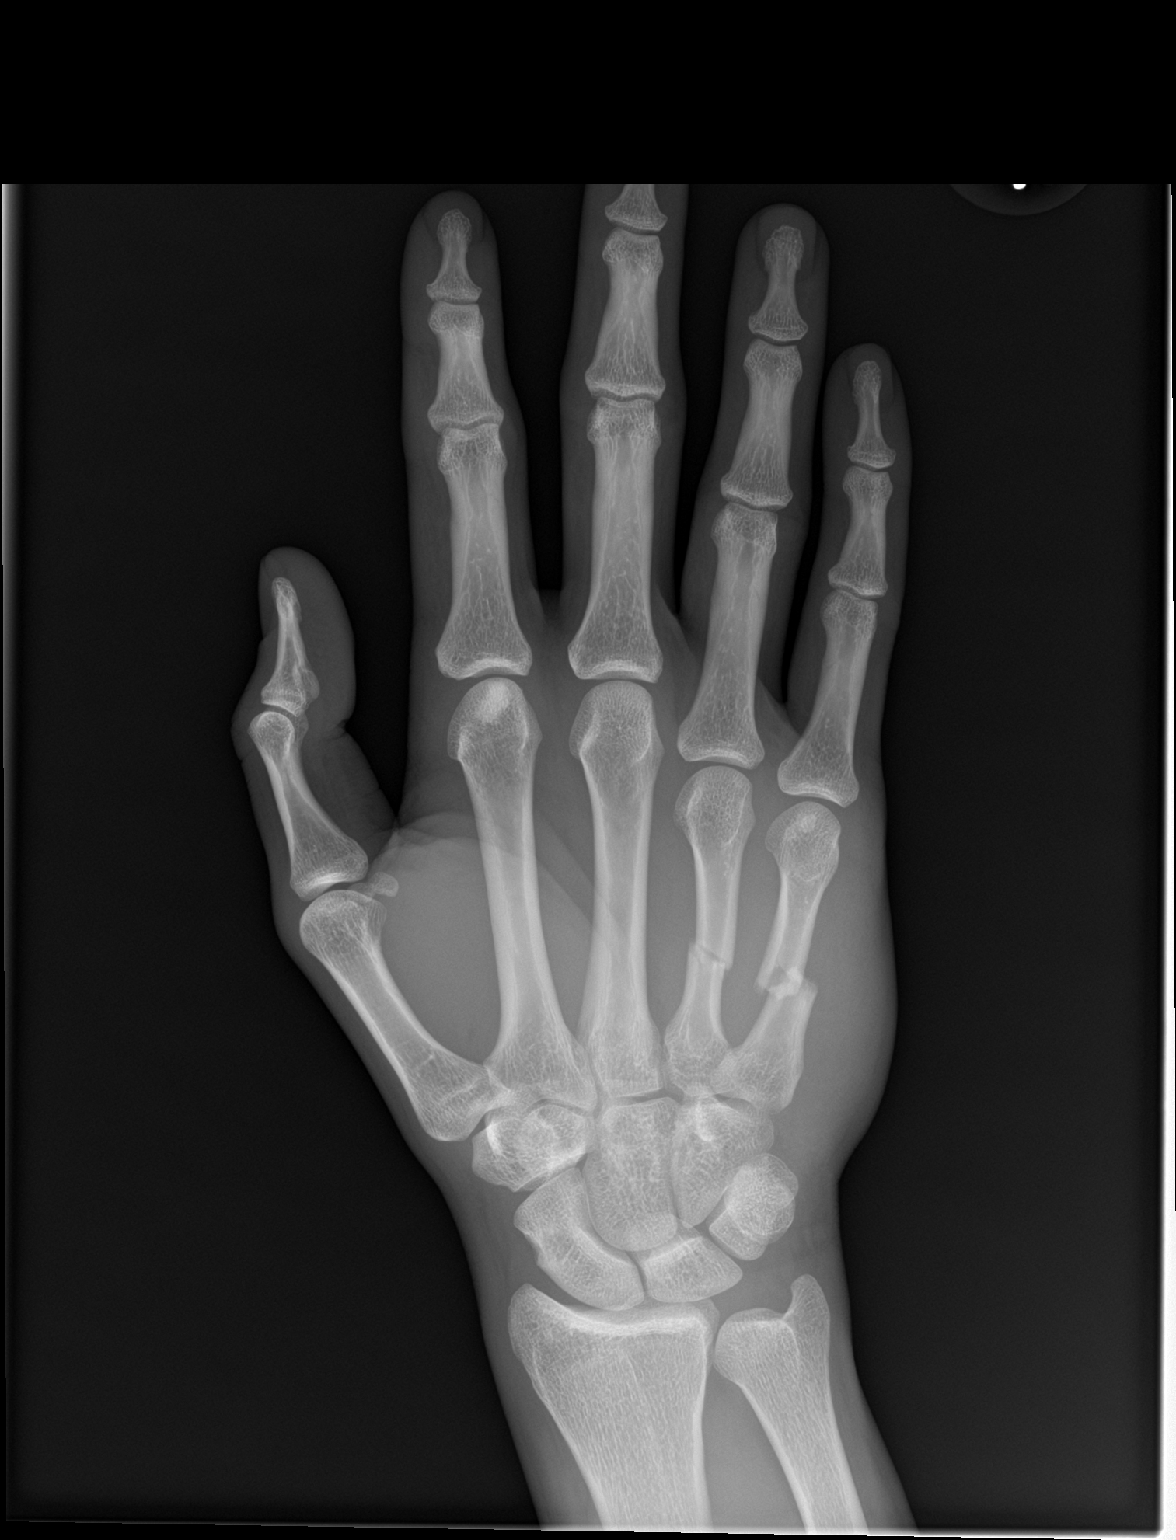

[hand obl]
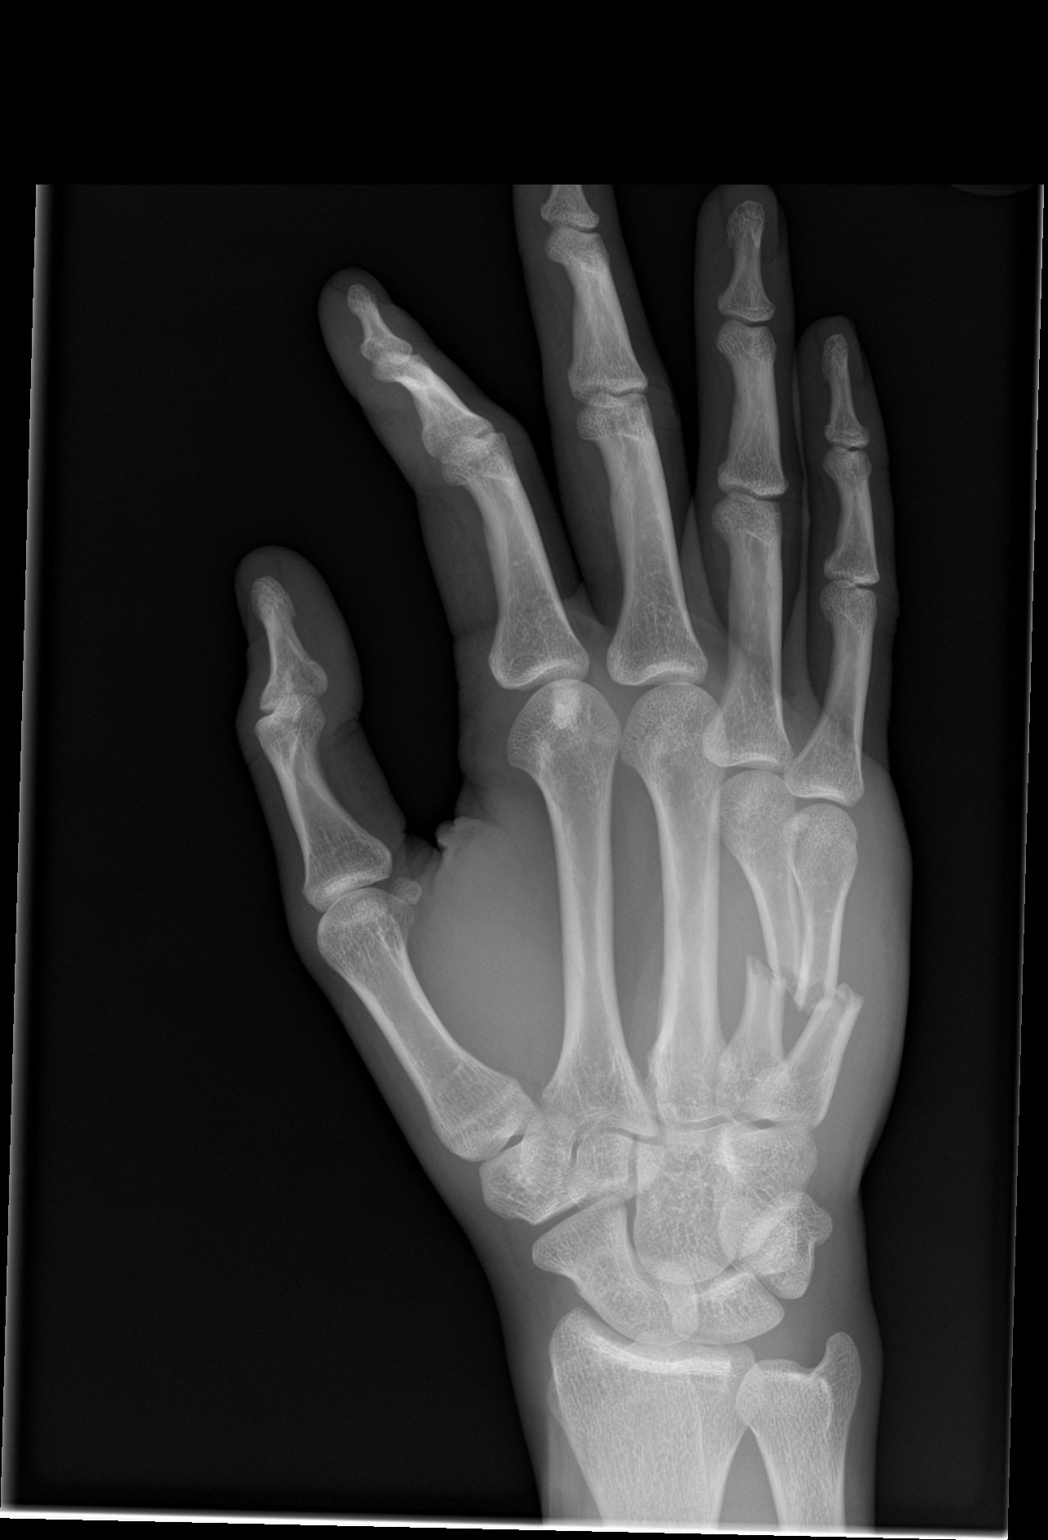

[hand lat]
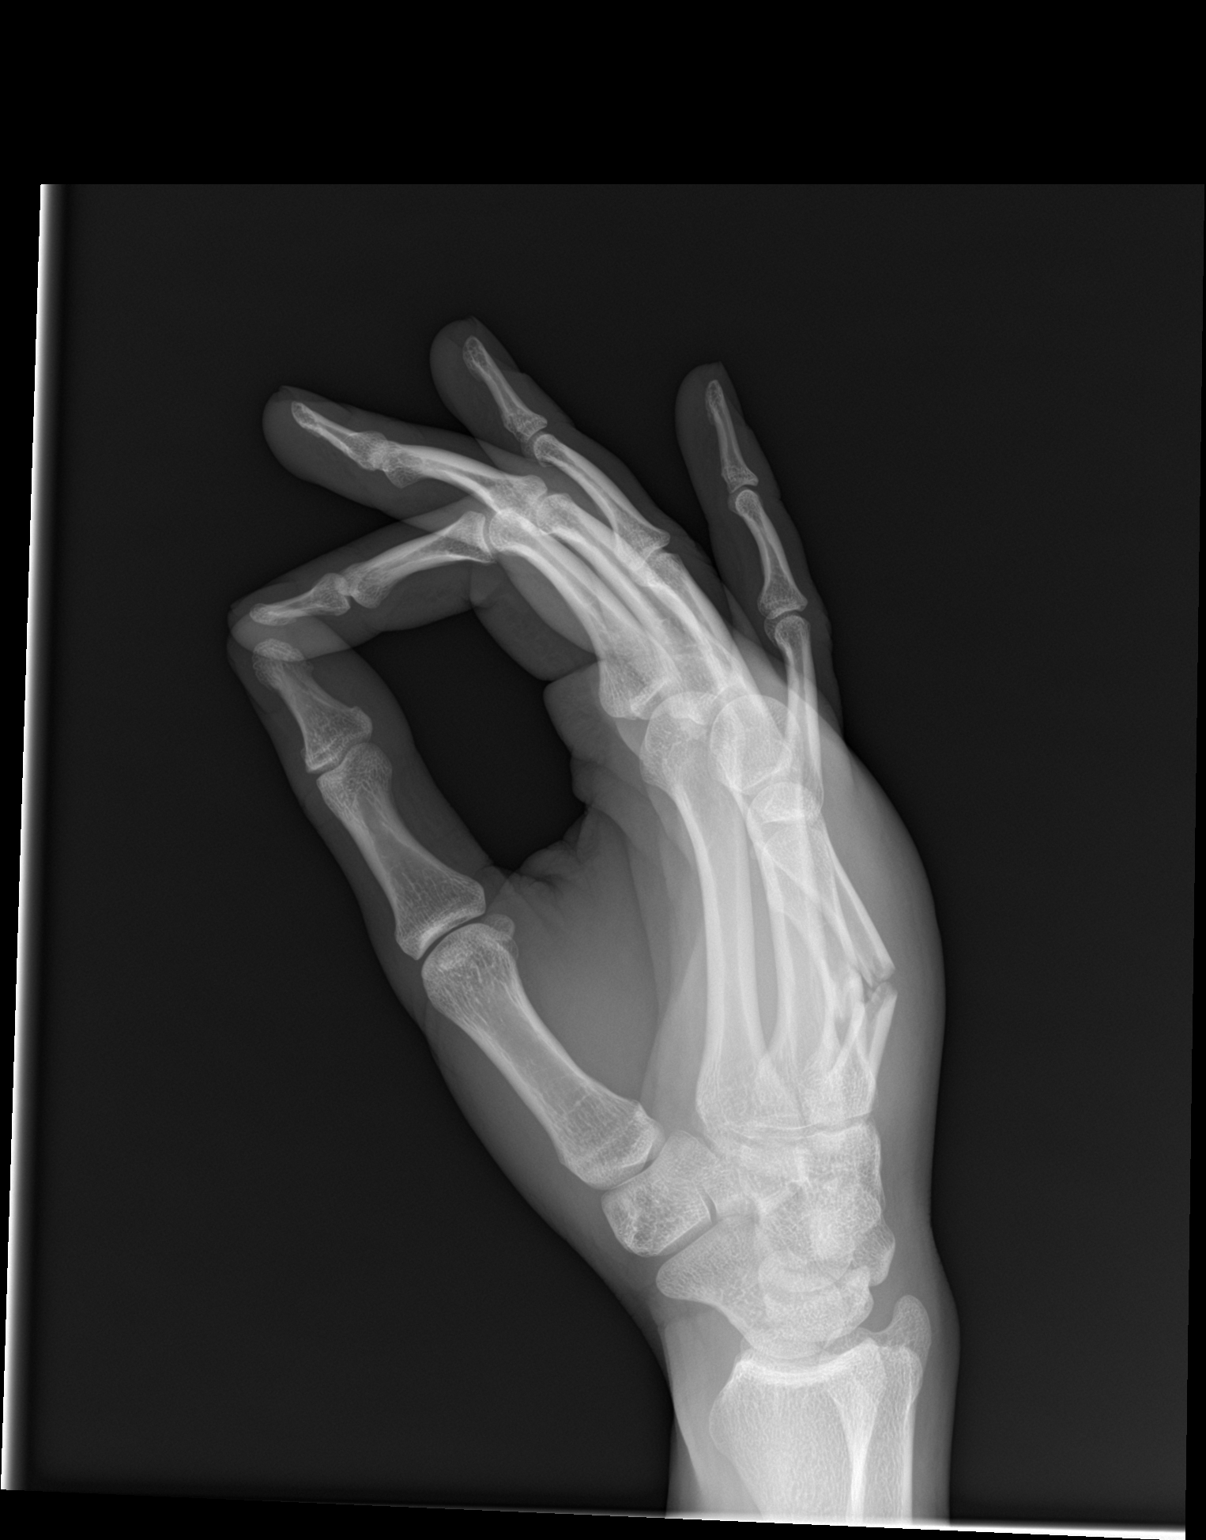

[3 of 3 positions shown; findings below may reference images not displayed]

FINDINGS: Displaced fractures of the mid fourth and fifth metacarpals with
mild volar angulation of the distal fracture fragments. There is no
dislocation. The bones are well mineralized. No arthritic changes.
Soft tissue swelling of the hand over the fractures. No radiopaque
foreign object.
IMPRESSION: Displaced and mildly angulated fractures of the mid fourth and fifth
metacarpals. No dislocation.

## 2019-02-14 IMAGING — CR DG WRIST COMPLETE 3+V*R*
4 series · 4 of 4 positions shown · non-contrast
Comparison: None.

CLINICAL DATA: 21-year-old male with fall and right wrist pain.

EXAM:
RIGHT HAND - COMPLETE 3+ VIEW; RIGHT WRIST - COMPLETE 3+ VIEW

[wrist pa]
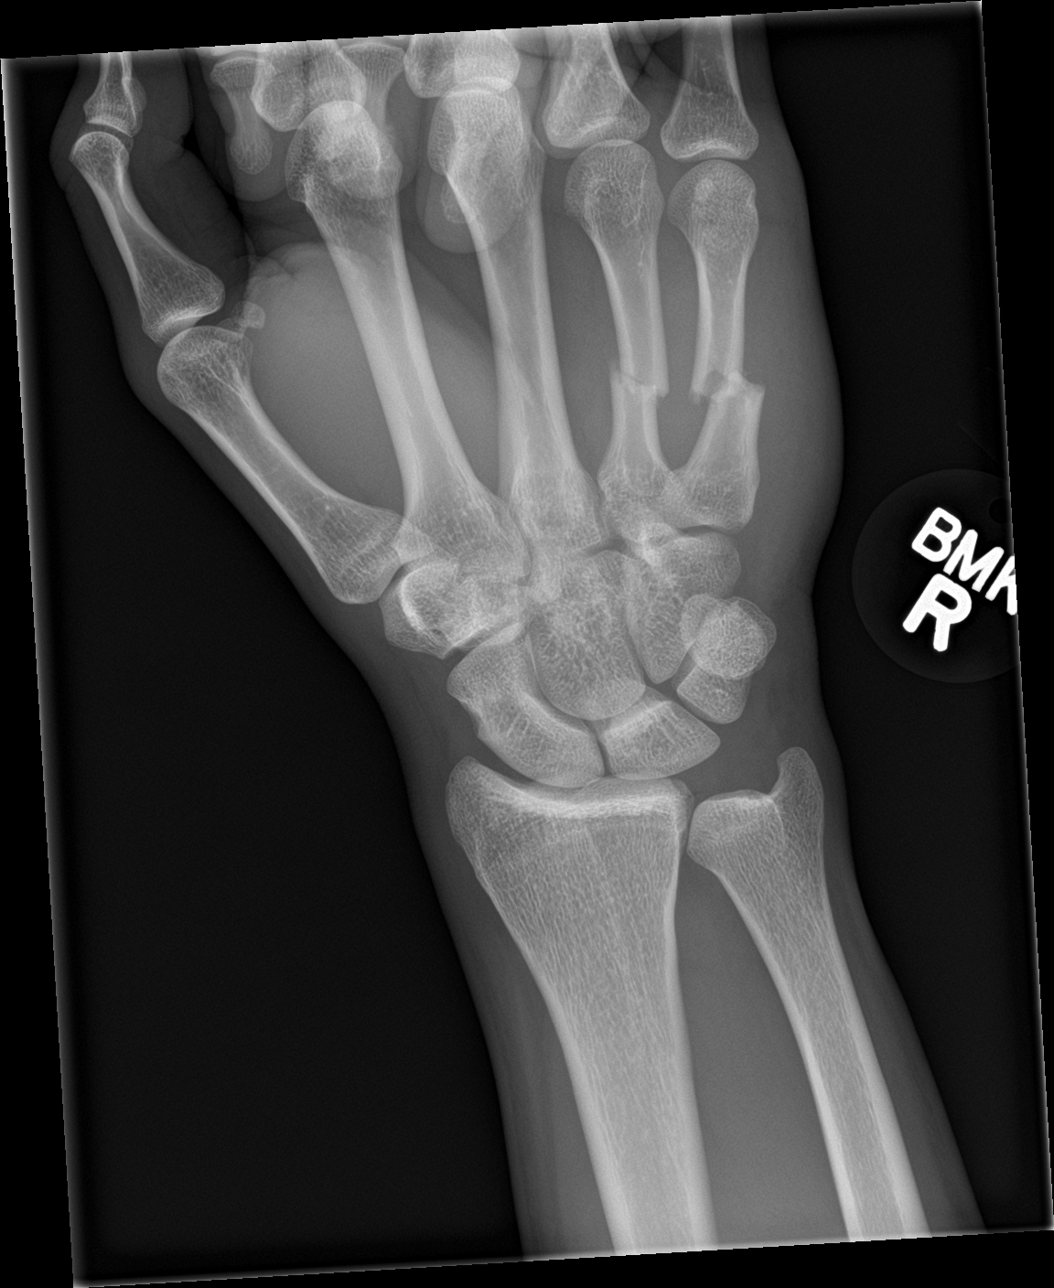

[wrist obl]
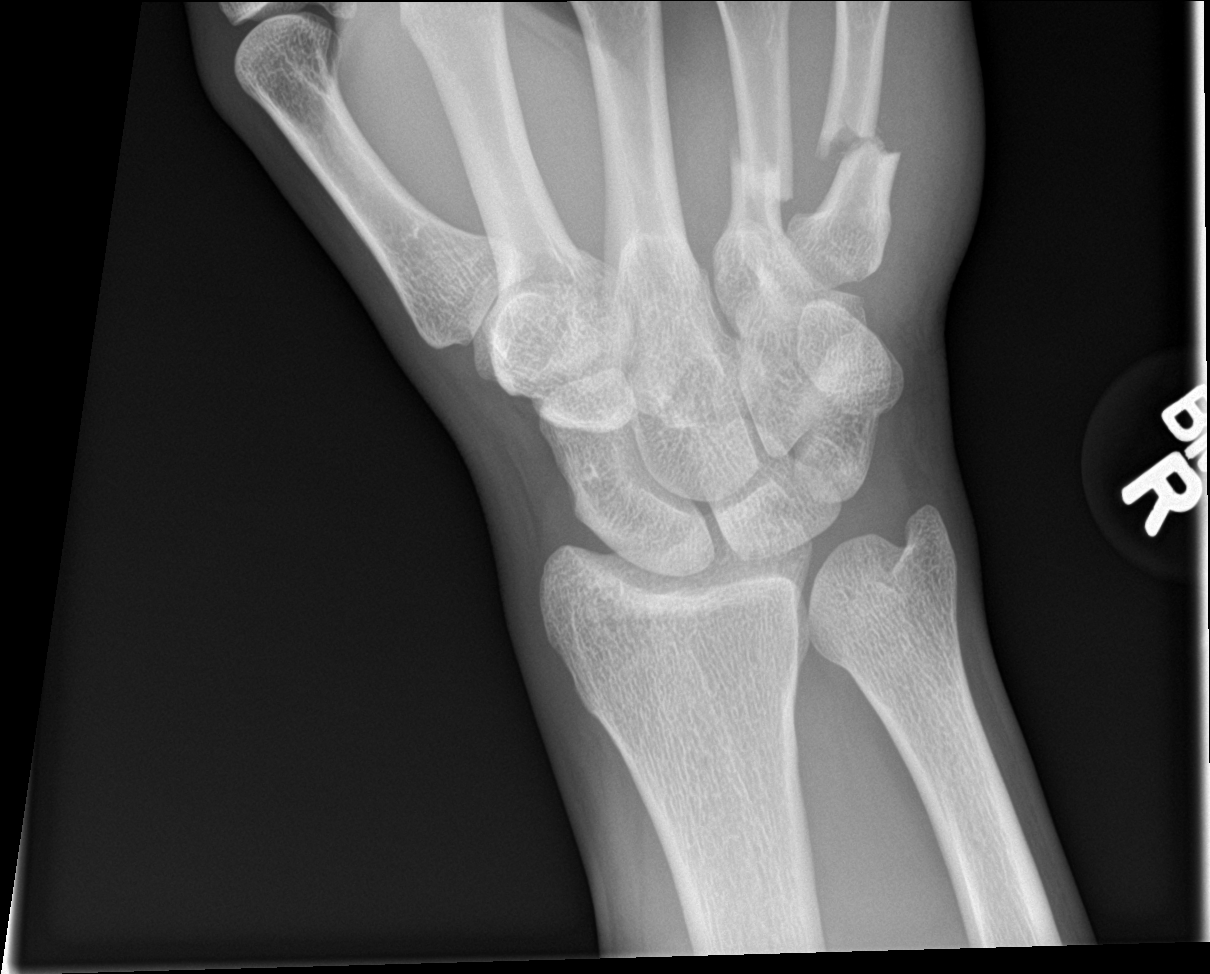

[wrist lat]
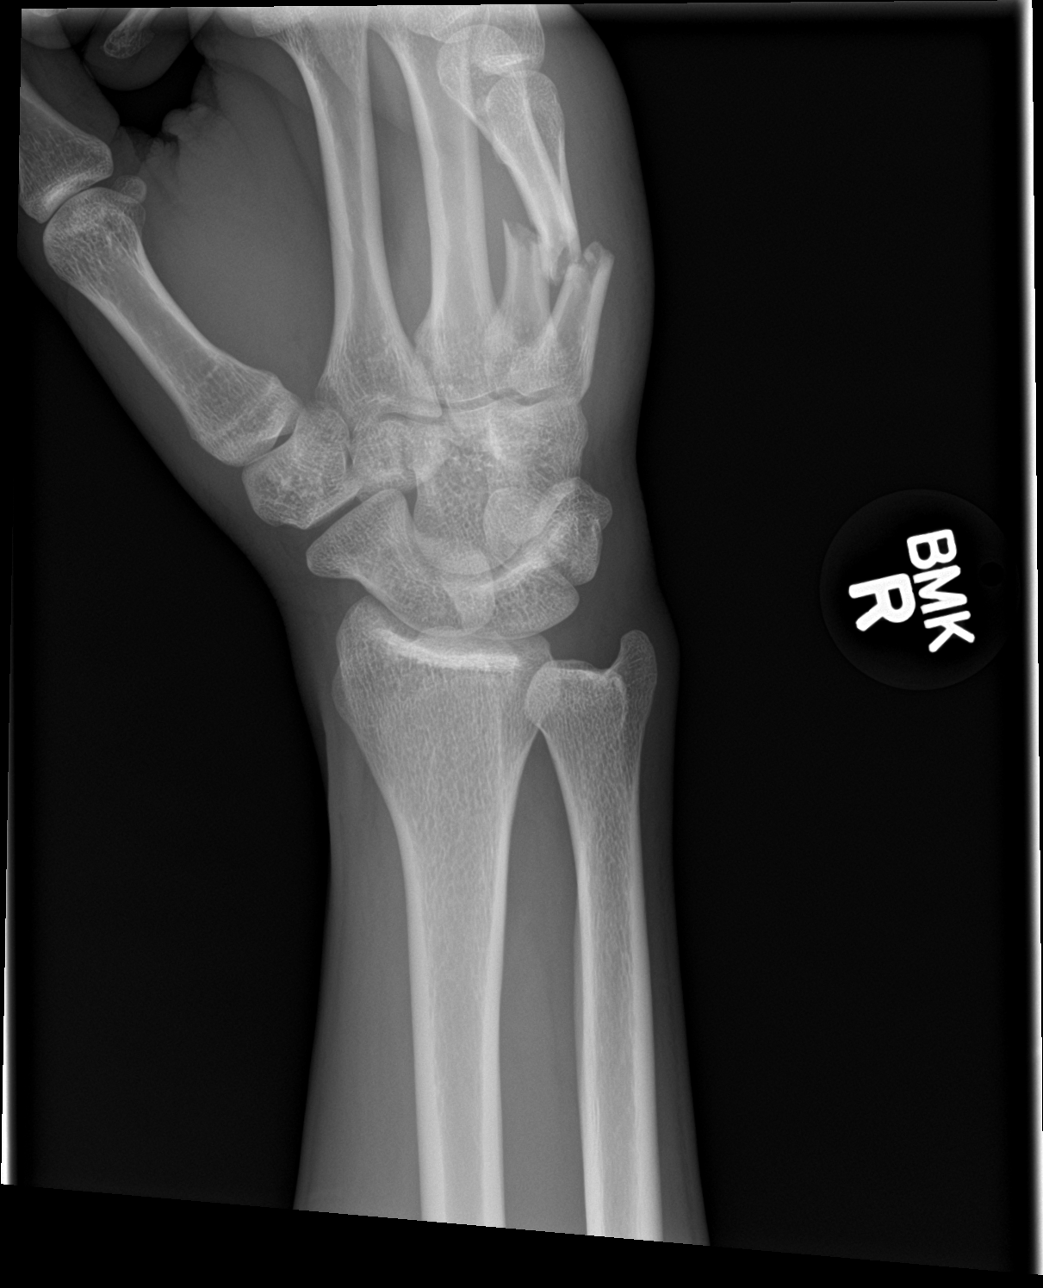

[navicular]
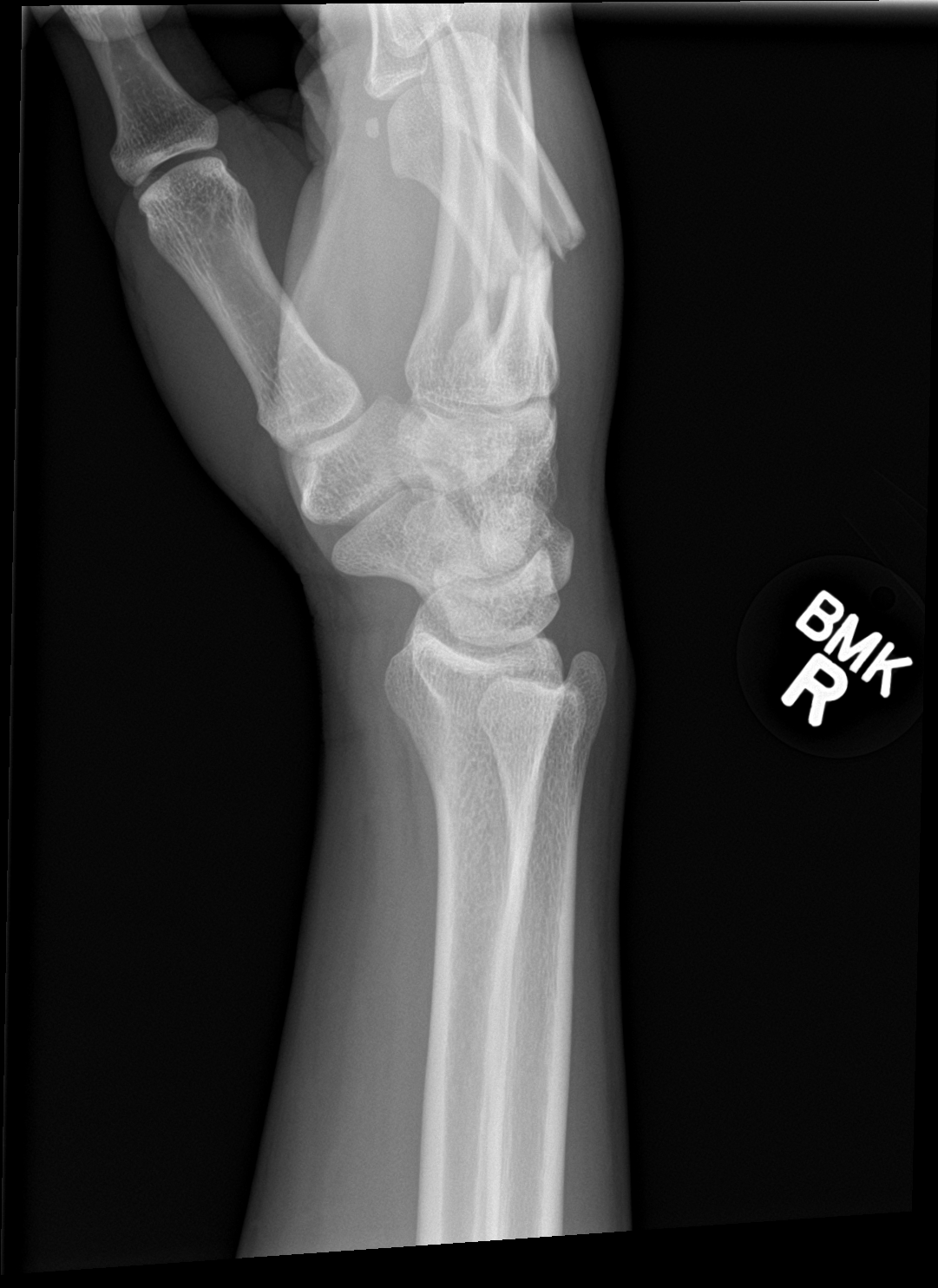

[4 of 4 positions shown; findings below may reference images not displayed]

FINDINGS: Displaced fractures of the mid fourth and fifth metacarpals with
mild volar angulation of the distal fracture fragments. There is no
dislocation. The bones are well mineralized. No arthritic changes.
Soft tissue swelling of the hand over the fractures. No radiopaque
foreign object.
IMPRESSION: Displaced and mildly angulated fractures of the mid fourth and fifth
metacarpals. No dislocation.
